# Patient Record
Sex: Female | Born: 1980 | Race: White | Hispanic: No
Health system: Southern US, Community
[De-identification: ages and names within clinical notes are randomized; demographics above are authoritative.]

## PROBLEM LIST (undated history)

## (undated) DIAGNOSIS — E282 Polycystic ovarian syndrome: Secondary | ICD-10-CM

## (undated) DIAGNOSIS — D6851 Activated protein C resistance: Secondary | ICD-10-CM

## (undated) DIAGNOSIS — E162 Hypoglycemia, unspecified: Secondary | ICD-10-CM

## (undated) DIAGNOSIS — M1990 Unspecified osteoarthritis, unspecified site: Secondary | ICD-10-CM

---

## 2013-07-19 ENCOUNTER — Emergency Department: Payer: Self-pay | Admitting: Emergency Medicine

## 2013-07-19 LAB — CBC WITH DIFFERENTIAL/PLATELET
Basophil #: 0 10*3/uL (ref 0.0–0.1)
Basophil %: 0.3 %
EOS PCT: 2.9 %
Eosinophil #: 0.2 10*3/uL (ref 0.0–0.7)
HCT: 41.9 % (ref 35.0–47.0)
HGB: 14.1 g/dL (ref 12.0–16.0)
LYMPHS ABS: 2.5 10*3/uL (ref 1.0–3.6)
Lymphocyte %: 37.1 %
MCH: 30.7 pg (ref 26.0–34.0)
MCHC: 33.8 g/dL (ref 32.0–36.0)
MCV: 91 fL (ref 80–100)
Monocyte #: 0.6 x10 3/mm (ref 0.2–0.9)
Monocyte %: 9.4 %
NEUTROS ABS: 3.4 10*3/uL (ref 1.4–6.5)
NEUTROS PCT: 50.3 %
PLATELETS: 152 10*3/uL (ref 150–440)
RBC: 4.61 10*6/uL (ref 3.80–5.20)
RDW: 13.2 % (ref 11.5–14.5)
WBC: 6.7 10*3/uL (ref 3.6–11.0)

## 2013-07-19 LAB — LIPASE, BLOOD: Lipase: 137 U/L (ref 73–393)

## 2013-07-20 LAB — URINALYSIS, COMPLETE
BLOOD: NEGATIVE
Bilirubin,UR: NEGATIVE
GLUCOSE, UR: NEGATIVE mg/dL (ref 0–75)
KETONE: NEGATIVE
Nitrite: NEGATIVE
PH: 6 (ref 4.5–8.0)
PROTEIN: NEGATIVE
RBC,UR: 7 /HPF (ref 0–5)
Specific Gravity: 1.015 (ref 1.003–1.030)
Squamous Epithelial: 22
WBC UR: 33 /HPF (ref 0–5)

## 2013-07-20 LAB — COMPREHENSIVE METABOLIC PANEL
ALBUMIN: 3 g/dL — AB (ref 3.4–5.0)
AST: 15 U/L (ref 15–37)
Alkaline Phosphatase: 64 U/L
Anion Gap: 7 (ref 7–16)
BUN: 7 mg/dL (ref 7–18)
Bilirubin,Total: 0.3 mg/dL (ref 0.2–1.0)
CREATININE: 0.49 mg/dL — AB (ref 0.60–1.30)
Calcium, Total: 8.3 mg/dL — ABNORMAL LOW (ref 8.5–10.1)
Chloride: 105 mmol/L (ref 98–107)
Co2: 27 mmol/L (ref 21–32)
Glucose: 100 mg/dL — ABNORMAL HIGH (ref 65–99)
Osmolality: 276 (ref 275–301)
Potassium: 3.2 mmol/L — ABNORMAL LOW (ref 3.5–5.1)
SGPT (ALT): 21 U/L (ref 12–78)
SODIUM: 139 mmol/L (ref 136–145)
Total Protein: 6.5 g/dL (ref 6.4–8.2)

## 2015-11-14 ENCOUNTER — Emergency Department
Admission: EM | Admit: 2015-11-14 | Discharge: 2015-11-14 | Disposition: A | Discharge status: Home / Self Care | Payer: 59 | Attending: Emergency Medicine | Admitting: Emergency Medicine

## 2015-11-14 ENCOUNTER — Encounter: Payer: Self-pay | Admitting: Medical Oncology

## 2015-11-14 ENCOUNTER — Emergency Department: Payer: 59

## 2015-11-14 DIAGNOSIS — M1990 Unspecified osteoarthritis, unspecified site: Secondary | ICD-10-CM | POA: Insufficient documentation

## 2015-11-14 DIAGNOSIS — M4726 Other spondylosis with radiculopathy, lumbar region: Secondary | ICD-10-CM | POA: Insufficient documentation

## 2015-11-14 DIAGNOSIS — M545 Low back pain: Secondary | ICD-10-CM | POA: Diagnosis present

## 2015-11-14 HISTORY — DX: Activated protein C resistance: D68.51

## 2015-11-14 HISTORY — DX: Polycystic ovarian syndrome: E28.2

## 2015-11-14 HISTORY — DX: Hypoglycemia, unspecified: E16.2

## 2015-11-14 HISTORY — DX: Unspecified osteoarthritis, unspecified site: M19.90

## 2015-11-14 LAB — POCT PREGNANCY, URINE: Preg Test, Ur: NEGATIVE

## 2015-11-14 MED ORDER — METHOCARBAMOL 500 MG PO TABS
1000.0000 mg | ORAL_TABLET | Freq: Once | ORAL | Status: AC
  Administered 2015-11-14: 1000 mg via ORAL
  Filled 2015-11-14: qty 2

## 2015-11-14 MED ORDER — METHOCARBAMOL 750 MG PO TABS: 750.0000 mg | ORAL_TABLET | Freq: Four times a day (QID) | ORAL | Status: AC

## 2015-11-14 MED ORDER — HYDROMORPHONE HCL 1 MG/ML IJ SOLN
1.0000 mg | Freq: Once | INTRAMUSCULAR | Status: AC
  Administered 2015-11-14: 1 mg via INTRAMUSCULAR
  Filled 2015-11-14: qty 1

## 2015-11-14 MED ORDER — OXYCODONE-ACETAMINOPHEN 7.5-325 MG PO TABS: 1.0000 | ORAL_TABLET | ORAL | Status: AC | PRN

## 2015-11-14 NOTE — ED Provider Notes (Signed)
[facility] Emergency Department Provider Note   ____________________________________________  Time seen: Approximately 2:24 PM  I have reviewed the triage vital signs and the nursing notes.   HISTORY  Chief Complaint Back Pain    HPI Rachel Shelton is a 35 y.o. female patient complaining of low back pain with radicular component to the left lower extremity. Patient state onset yesterday after falling on a slip and slide. Patient stated pain increases with a.m. awakening. No palliative measures taken for this complaint. Patient denies any bladder or bowel dysfunction. Patient rated the pain as a 9/10.   Past Medical History  Diagnosis Date  . PCOS (polycystic ovarian syndrome)   . Factor 5 Leiden mutation, heterozygous (HCC)   . Hypoglycemia   . Arthritis     There are no active problems to display for this patient.   No past surgical history on file.  Current Outpatient Rx  Name  Route  Sig  Dispense  Refill  . methocarbamol (ROBAXIN-750) 750 MG tablet   Oral   Take 1 tablet (750 mg total) by mouth 4 (four) times daily.   20 tablet   0   . oxyCODONE-acetaminophen (PERCOCET) 7.5-325 MG tablet   Oral   Take 1 tablet by mouth every 4 (four) hours as needed for severe pain.   20 tablet   0     Allergies Aspirin; Motrin; Nsaids; Penicillins; Prednisone; Tramadol; and Vicodin  No family history on file.  Social History Social History  Substance Use Topics  . Smoking status: None  . Smokeless tobacco: None  . Alcohol Use: None    Review of Systems Constitutional: No fever/chills Eyes: No visual changes. ENT: No sore throat. Cardiovascular: Denies chest pain. Respiratory: Denies shortness of breath. Gastrointestinal: No abdominal pain.  No nausea, no vomiting.  No diarrhea.  No constipation. Genitourinary: Negative for dysuria. PCOS Musculoskeletal: Negative for back pain. Skin: Negative for rash. Neurological: Negative for  headaches, focal weakness or numbness. Endocrine:Hypoglycemic Hematological/Lymphatic:Factor 5 Leiden mutation Allergic/Immunilogical: See medication list  ____________________________________________   PHYSICAL EXAM:  VITAL SIGNS: ED Triage Vitals  Enc Vitals Group     BP 11/14/15 1353 139/92 mmHg     Pulse Rate 11/14/15 1353 75     Resp 11/14/15 1353 18     Temp 11/14/15 1353 98.7 F (37.1 C)     Temp Source 11/14/15 1353 Oral     SpO2 11/14/15 1353 98 %     Weight 11/14/15 1353 240 lb (108.863 kg)     Height 11/14/15 1353  (1.626 m)     Head Cir --      Peak Flow --      Pain Score 11/14/15 1354 9     Pain Loc --      Pain Edu? --      Excl. in GC? --     Constitutional: Alert and oriented. Well appearing and in no acute distress. Eyes: Conjunctivae are normal. PERRL. EOMI. Head: Atraumatic. Nose: No congestion/rhinnorhea. Mouth/Throat: Mucous membranes are moist.  Oropharynx non-erythematous. Neck: No stridor.  No cervical spine tenderness to palpation. Hematological/Lymphatic/Immunilogical: No cervical lymphadenopathy. Cardiovascular: Normal rate, regular rhythm. Grossly normal heart sounds.  Good peripheral circulation. Respiratory: Normal respiratory effort.  No retractions. Lungs CTAB. Gastrointestinal: Soft and nontender. No distention. No abdominal bruits. No CVA tenderness. Musculoskeletal: No lower extremity tenderness nor edema.  No joint effusions. Neurologic:  Normal speech and language. No gross focal neurologic deficits are appreciated. No  gait instability. Skin:  Skin is warm, dry and intact. No rash noted. Psychiatric: Mood and affect are normal. Speech and behavior are normal.  ____________________________________________   LABS (all labs ordered are listed, but only abnormal results are displayed)  Labs Reviewed  POC URINE PREG, ED  POCT PREGNANCY, URINE    ____________________________________________  EKG   ____________________________________________  RADIOLOGY  X-rays remarkable mild degenerative disc disease and disc space narrowing at L4-L5. ____________________________________________   PROCEDURES  Procedure(s) performed: None  Critical Care performed: No  ____________________________________________   INITIAL IMPRESSION / ASSESSMENT AND PLAN / ED COURSE  Pertinent labs & imaging results that were available during my care of the patient were reviewed by me and considered in my medical decision making (see chart for details).  Back pain secondary to arthritis and disc space narrowing. Discussed x-ray finding with patient. Patient given discharge Instructions. Patient advised follow-up family doctor for consult to orthopedics. Patient given a prescription for Percocets and Robaxin. ____________________________________________   FINAL CLINICAL IMPRESSION(S) / ED DIAGNOSES  Final diagnoses:  Osteoarthritis of spine with radiculopathy, lumbar region      NEW MEDICATIONS STARTED DURING THIS VISIT:  New Prescriptions   METHOCARBAMOL (ROBAXIN-750) 750 MG TABLET    Take 1 tablet (750 mg total) by mouth 4 (four) times daily.   OXYCODONE-ACETAMINOPHEN (PERCOCET) 7.5-325 MG TABLET    Take 1 tablet by mouth every 4 (four) hours as needed for severe pain.     Note:  This document was prepared using Dragon voice recognition software and may include unintentional dictation errors.    Joni Reiningonald K Jazira Maloney, PA-C 11/14/15 1601  Emily FilbertJonathan E Williams, MD 11/15/15 0700

## 2015-11-14 NOTE — ED Notes (Signed)
Pt reports lower back pain with radiation of pain into left leg that began yesterday after being on the slip and slide.

## 2016-12-01 IMAGING — CR DG LUMBAR SPINE COMPLETE 4+V
1 series · 5 of 5 positions shown · non-contrast
Comparison: None.

CLINICAL DATA: Low back pain following fall yesterday, initial
encounter

EXAM:
LUMBAR SPINE - COMPLETE 4+ VIEW

[Series 1: dg lumbar spine complete 4 +v · 0.14mm/px · 5 of 5 slices shown]
[im 1/5]
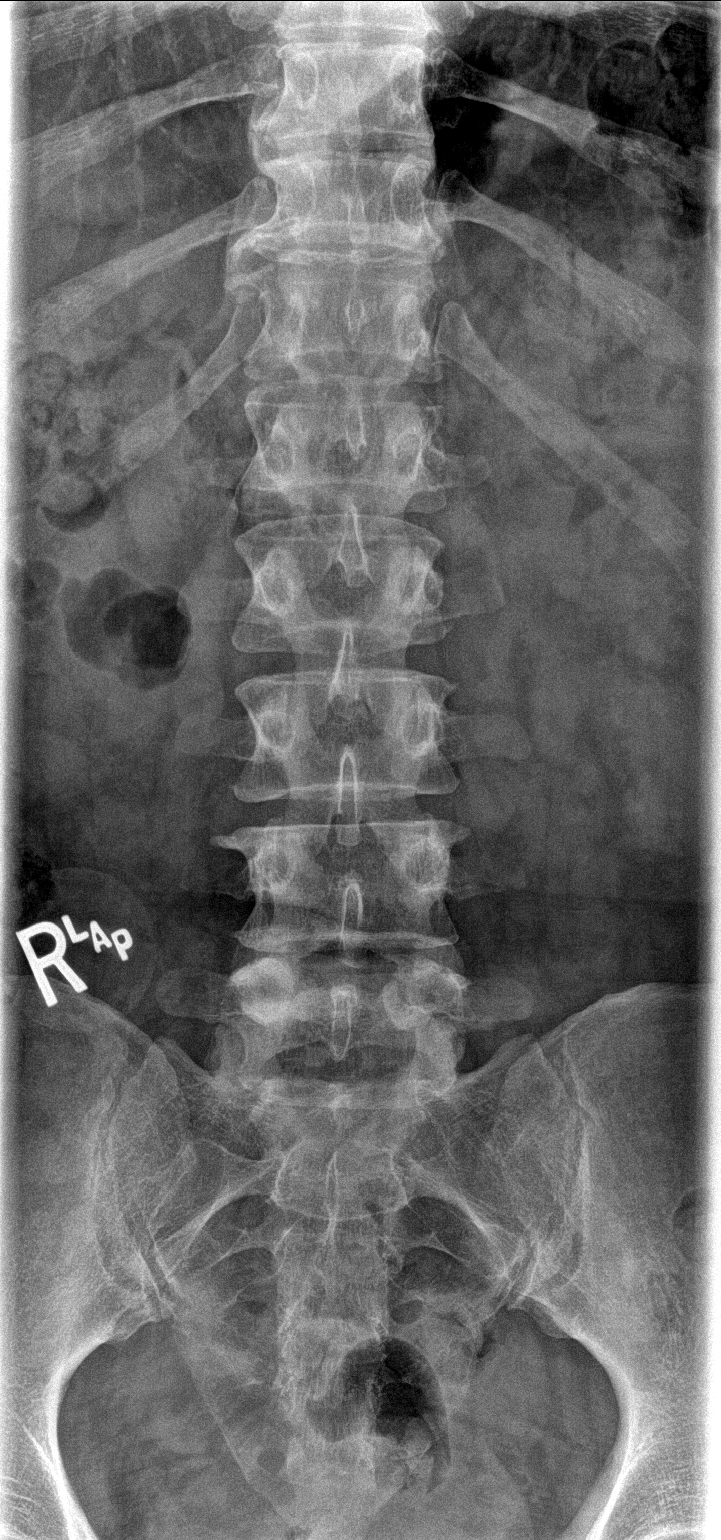
[im 2/5]
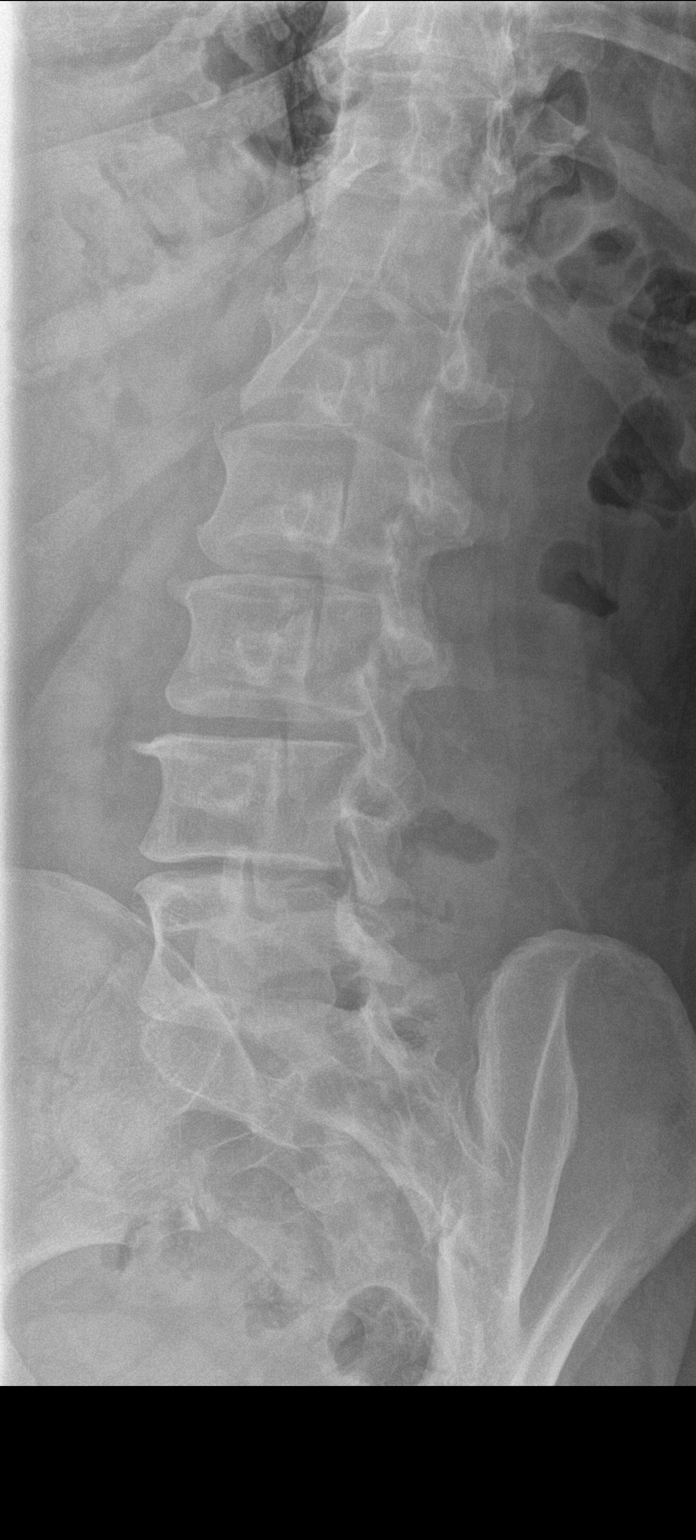
[im 3/5]
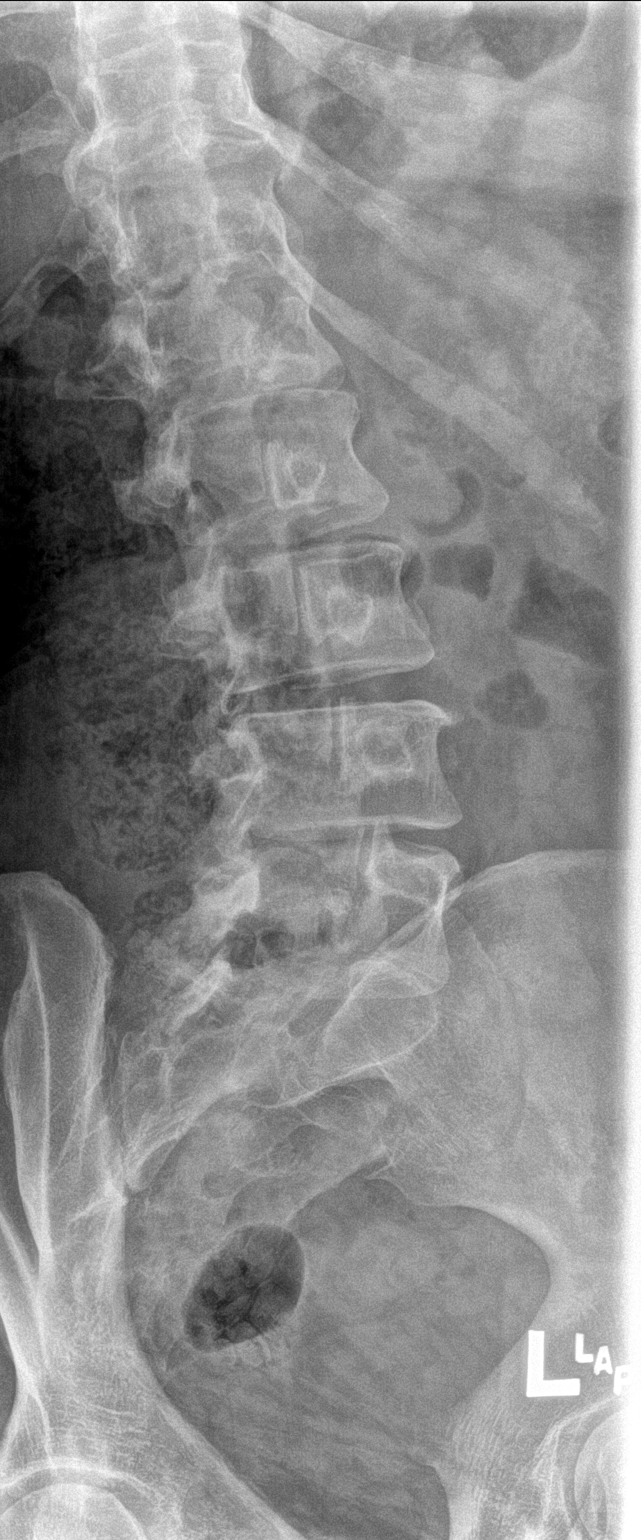
[im 4/5]
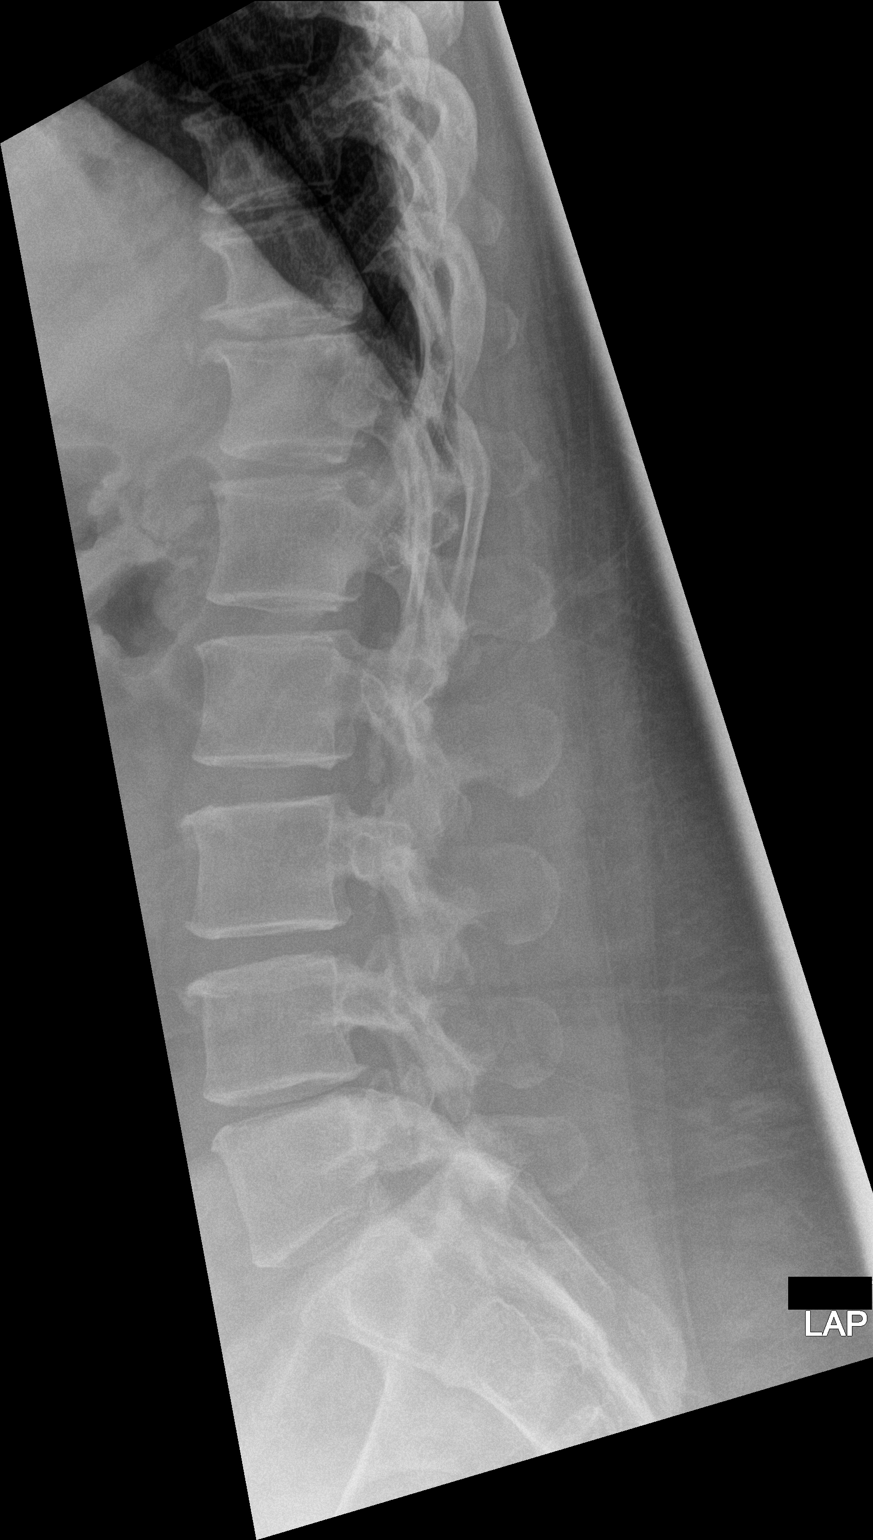
[im 5/5]
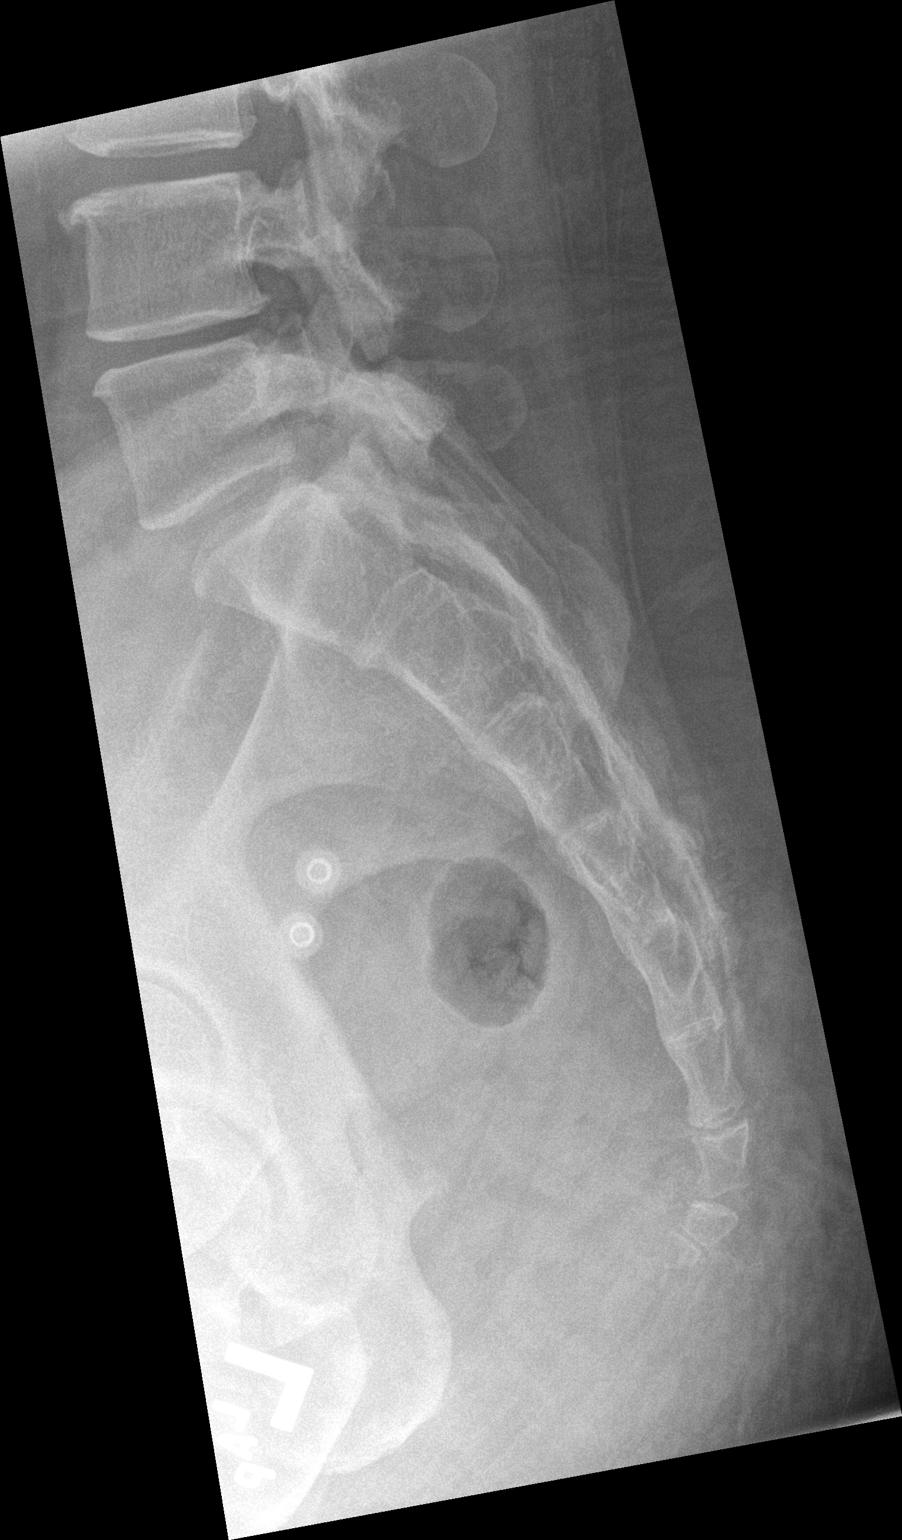

[5 of 5 positions shown; findings below may reference images not displayed]

FINDINGS: Vertebral body height is well maintained. Mild osteophytic changes
are seen. No pars defects are noted. Disc space narrowing is seen at
L4-5. No acute soft tissue abnormality is seen.
IMPRESSION: Mild degenerative change without acute abnormality.
